# Patient Record
Sex: Female | Born: 1960 | Race: White | Hispanic: No | Marital: Married | Smoking: Never smoker
Health system: Southern US, Community
[De-identification: ages and names within clinical notes are randomized; demographics above are authoritative.]

## PROBLEM LIST (undated history)

## (undated) DIAGNOSIS — Z8632 Personal history of gestational diabetes: Secondary | ICD-10-CM

## (undated) HISTORY — PX: UMBILICAL HERNIA REPAIR: SHX196

## (undated) HISTORY — DX: Personal history of gestational diabetes: Z86.32

## (undated) HISTORY — PX: LAPAROSCOPY: SHX197

---

## 1997-10-13 ENCOUNTER — Inpatient Hospital Stay (HOSPITAL_COMMUNITY): Admission: AD | Admit: 1997-10-13 | Discharge: 1997-10-13 | Payer: Self-pay | Admitting: Gynecology

## 1997-10-27 ENCOUNTER — Other Ambulatory Visit: Admission: RE | Admit: 1997-10-27 | Discharge: 1997-10-27 | Payer: Self-pay | Admitting: Gynecology

## 1998-01-01 ENCOUNTER — Inpatient Hospital Stay (HOSPITAL_COMMUNITY): Admission: AD | Admit: 1998-01-01 | Discharge: 1998-01-04 | Payer: Self-pay | Admitting: Gynecology

## 1998-08-26 ENCOUNTER — Other Ambulatory Visit: Admission: RE | Admit: 1998-08-26 | Discharge: 1998-08-26 | Payer: Self-pay | Admitting: Gynecology

## 1999-10-17 ENCOUNTER — Other Ambulatory Visit: Admission: RE | Admit: 1999-10-17 | Discharge: 1999-10-17 | Payer: Self-pay | Admitting: Gynecology

## 2000-10-17 ENCOUNTER — Other Ambulatory Visit: Admission: RE | Admit: 2000-10-17 | Discharge: 2000-10-17 | Payer: Self-pay | Admitting: Gynecology

## 2001-10-29 ENCOUNTER — Other Ambulatory Visit: Admission: RE | Admit: 2001-10-29 | Discharge: 2001-10-29 | Payer: Self-pay | Admitting: Gynecology

## 2003-04-06 ENCOUNTER — Other Ambulatory Visit: Admission: RE | Admit: 2003-04-06 | Discharge: 2003-04-06 | Payer: Self-pay | Admitting: Gynecology

## 2004-04-24 HISTORY — PX: BACK SURGERY: SHX140

## 2005-02-07 ENCOUNTER — Other Ambulatory Visit: Admission: RE | Admit: 2005-02-07 | Discharge: 2005-02-07 | Payer: Self-pay | Admitting: Gynecology

## 2005-02-17 ENCOUNTER — Emergency Department (HOSPITAL_COMMUNITY): Admission: EM | Admit: 2005-02-17 | Discharge: 2005-02-17 | Payer: Self-pay | Admitting: Family Medicine

## 2005-02-28 ENCOUNTER — Ambulatory Visit (HOSPITAL_COMMUNITY): Admission: RE | Admit: 2005-02-28 | Discharge: 2005-02-28 | Payer: Self-pay | Admitting: Chiropractic Medicine

## 2005-03-25 ENCOUNTER — Ambulatory Visit (HOSPITAL_COMMUNITY): Admission: RE | Admit: 2005-03-25 | Discharge: 2005-03-25 | Payer: Self-pay | Admitting: Neurosurgery

## 2006-03-13 ENCOUNTER — Other Ambulatory Visit: Admission: RE | Admit: 2006-03-13 | Discharge: 2006-03-13 | Payer: Self-pay | Admitting: Gynecology

## 2007-06-18 ENCOUNTER — Other Ambulatory Visit: Admission: RE | Admit: 2007-06-18 | Discharge: 2007-06-18 | Payer: Self-pay | Admitting: Gynecology

## 2009-01-05 ENCOUNTER — Other Ambulatory Visit: Admission: RE | Admit: 2009-01-05 | Discharge: 2009-01-05 | Payer: Self-pay | Admitting: Gynecology

## 2009-01-05 ENCOUNTER — Ambulatory Visit: Payer: Self-pay | Admitting: Gynecology

## 2009-01-05 ENCOUNTER — Encounter: Payer: Self-pay | Admitting: Gynecology

## 2010-03-11 ENCOUNTER — Other Ambulatory Visit: Admission: RE | Admit: 2010-03-11 | Discharge: 2010-03-11 | Payer: Self-pay | Admitting: Gynecology

## 2010-03-11 ENCOUNTER — Ambulatory Visit: Payer: Self-pay | Admitting: Gynecology

## 2010-04-19 ENCOUNTER — Ambulatory Visit: Payer: Self-pay | Admitting: Gynecology

## 2010-09-09 NOTE — Op Note (Signed)
NAMEAPURVA, Janice Sandoval            ACCOUNT NO.:  [id number]   MEDICAL RECORD NO.:  [id number]          PATIENT TYPE:  OIB   LOCATION:  3036                         FACILITY:  [facility]   PHYSICIAN:  Kathaleen Maser. Pool, M.D.    DATE OF BIRTH:  11-Dec-1960   DATE OF PROCEDURE:  03/25/2005  DATE OF DISCHARGE:  03/25/2005                                 OPERATIVE REPORT   ATTENDING PHYSICIAN:  Sherilyn Cooter A. Pool, M.D.   SERVICE:  Neurosurgery.   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   POSTOPERATIVE DIAGNOSIS:  Left L3-4 extrapyramidal herniated nucleus  pulposus with radiculopathy.   PROCEDURE NAME:  Left L3-4 extraforaminal microdiskectomy.   SURGEON:  Kathaleen Maser. Pool, M.D.   ASSISTANT:  Reinaldo Meeker, M.D.   ANESTHESIA:  General oral endotracheal.   INDICATIONS:  Ms. Teschner is a 50 year old female with a history of severe  left lower extremity pain consistent with a left-sided L3 radiculopathy  which has failed conservative management.  Workup demonstrates evidence of a  very large left sided L3-4 extraforaminal disk herniation with marked  compression of the exiting L3 nerve root.  Patient was counseled as to her  options.  She decided to proceed with a left sided L3-4 extraforaminal  microdiskectomy.   OPERATIVE NOTE:  Patient taken to the operating room, placed on the table in  a supine position.  After adequate level of anesthesia achieved, patient  positioned prone onto a Wilson frame and appropriately padded.  The  patient's lumbar region is prepped and draped sterilely.  A 10 blade  speculum linear skin incision overlying the L3-4 interspace.  This was  carried down sharply in the midline.  A subperiosteal dissection was then  performed exposing the lamina facet joints of L3 and L4 as well as the  transverse process of L3.  Deep self-retaining retractor was placed,  intraoperative x-rays taken, level was confirmed.  An  extraforaminal  approach was then performed using the high speed drill to remove a small  aspect of the lateral part of the superior facet of L4.  The intertransverse  ligament was then elevated and resected in a piecemeal fashion using  Kerrison rongeurs.  Microscope was then brought in to the field and used for  microdissection of the left sided L3 nerve root and underlying disk  herniation.  The L3 nerve root was identified.  The L3 nerve root was then  gently mobilized cephalad and the underlying disk herniation was apparent.  The thin membrane overlying the disk herniation was dissected free.  A very  large fragment of disk was then encountered and removed in three large  pieces.  This completely decompressed the muscle at L3 nerve root.  The  extraforaminal space was further explored and no further areas of disk  herniation or nerve root compression was found.  There is no injury to the  thecal sac or nerve roots.  The wound was then irrigated with antibiotic  solution.  Depo-Medrol was then placed over the L3-4 nerve root and then  Gelfoam was placed over the extraforaminal  space.  Retractor system was removed.  Hemostasis of the muscle was achieved with  electrocautery.  Wound was then closed in layers with Vicryl sutures.  Steri-  Strips and a sterile dressing was applied.  There were no apparent  complications.  Patient tolerated the procedure well and she returns to the  recovery room postop.           ______________________________  Kathaleen Maser. Pool, M.D.     HAP/MEDQ  D:  03/25/2005  T:  03/26/2005  Job:  [id number]

## 2011-03-14 ENCOUNTER — Encounter: Payer: Self-pay | Admitting: *Deleted

## 2011-03-14 DIAGNOSIS — Z8632 Personal history of gestational diabetes: Secondary | ICD-10-CM | POA: Insufficient documentation

## 2011-03-14 DIAGNOSIS — N809 Endometriosis, unspecified: Secondary | ICD-10-CM | POA: Insufficient documentation

## 2011-03-23 ENCOUNTER — Encounter: Payer: Self-pay | Admitting: Gynecology

## 2011-04-04 ENCOUNTER — Encounter: Payer: Self-pay | Admitting: Gynecology

## 2011-04-07 ENCOUNTER — Encounter: Payer: Self-pay | Admitting: Gynecology

## 2011-04-07 ENCOUNTER — Ambulatory Visit (INDEPENDENT_AMBULATORY_CARE_PROVIDER_SITE_OTHER): Payer: Managed Care, Other (non HMO) | Admitting: Gynecology

## 2011-04-07 VITALS — BP 114/70 | Ht 66.75 in | Wt 143.0 lb

## 2011-04-07 DIAGNOSIS — Z01419 Encounter for gynecological examination (general) (routine) without abnormal findings: Secondary | ICD-10-CM

## 2011-04-07 DIAGNOSIS — Z131 Encounter for screening for diabetes mellitus: Secondary | ICD-10-CM

## 2011-04-07 DIAGNOSIS — Z13220 Encounter for screening for lipoid disorders: Secondary | ICD-10-CM

## 2011-04-07 LAB — GLUCOSE, RANDOM: Glucose, Bld: 90 mg/dL (ref 70–99)

## 2011-04-07 NOTE — Progress Notes (Signed)
Janice Sandoval 04-28-1960 [id number]        50 y.o.  for annual exam.  Doing well no complaints.  Past medical history,surgical history, medications, allergies, family history and social history were all reviewed and documented in the EPIC chart. ROS:  Was performed and pertinent positives and negatives are included in the history.  Exam: chaperone present Filed Vitals:   04/07/11 1124  BP: 114/70   General appearance  Normal Skin grossly normal Head/Neck normal with no cervical or supraclavicular adenopathy thyroid normal Lungs  clear Cardiac RR, without RMG Abdominal  soft, nontender, without masses, organomegaly or hernia Breasts  examined lying and sitting without masses, retractions, discharge or axillary adenopathy. Pelvic  Ext/BUS/vagina  normal   Cervix  normal    Uterus  introverted, normal size, shape and contour, midline and mobile nontender   Adnexa  Without masses or tenderness    Anus and perineum  normal   Rectovaginal  normal sphincter tone without palpated masses or tenderness.    Assessment/Plan:  50 y.o. female for annual exam.    1. Postmenopausal. Patient is doing well without symptoms. I reviewed the issues of HRT and the potential for prophylactic protection as far as bone and cardiovascular standpoint particular since she underwent apposite early. The WHI study and risks of HRT have been discussed with her. She is not interested in HRT is comfortable with expectant management. 2. Pap smear. Patient has no history of abnormal Pap smears in the past. She has received regular Pap smears with last Pap smear 02/2010. Current screening guidelines with less frequent intervals reviewed and she is comfortable with this, no Pap smear was done today and we'll plan on an every 3 year Pap smear. 3. Breast health. SBE monthly reviewed. Patient had her mammogram last week and will continue with annual mammography. 4. Bone health. Patient had DEXA December 2011 which was  normal. We'll plan repeat in 5 year interval. Increase calcium vitamin D reviewed. 5. Health maintenance. Will check baseline CBC, urinalysis, lipid profile, glucose. Assuming she continues well from a gynecologic standpoint she will see me in a year sooner as needed.    Dara Lords MD, 11:45 AM 04/07/2011

## 2011-04-07 NOTE — Patient Instructions (Signed)
Follow up in one year for annual GYN checkup

## 2011-04-07 NOTE — Progress Notes (Signed)
Addended by: Landis Martins R on: 04/07/2011 12:17 PM   Modules accepted: Orders

## 2011-04-08 LAB — VITAMIN D 25 HYDROXY (VIT D DEFICIENCY, FRACTURES): Vit D, 25-Hydroxy: 23 ng/mL — ABNORMAL LOW (ref 30–89)

## 2011-04-21 ENCOUNTER — Other Ambulatory Visit: Payer: Self-pay | Admitting: *Deleted

## 2012-07-15 ENCOUNTER — Encounter: Payer: Self-pay | Admitting: Gynecology

## 2012-09-10 ENCOUNTER — Other Ambulatory Visit: Payer: Self-pay | Admitting: Dermatology

## 2013-04-04 ENCOUNTER — Other Ambulatory Visit: Payer: Self-pay | Admitting: Dermatology

## 2013-05-01 ENCOUNTER — Other Ambulatory Visit: Payer: Self-pay | Admitting: Dermatology

## 2013-08-19 ENCOUNTER — Other Ambulatory Visit: Payer: Self-pay | Admitting: Dermatology

## 2013-08-29 ENCOUNTER — Other Ambulatory Visit: Payer: Self-pay | Admitting: Dermatology

## 2013-12-24 ENCOUNTER — Encounter: Payer: Self-pay | Admitting: Sports Medicine

## 2013-12-24 ENCOUNTER — Ambulatory Visit (INDEPENDENT_AMBULATORY_CARE_PROVIDER_SITE_OTHER): Payer: Managed Care, Other (non HMO) | Admitting: Sports Medicine

## 2013-12-24 VITALS — BP 125/78 | HR 93 | Ht 67.0 in | Wt 150.0 lb

## 2013-12-24 DIAGNOSIS — M722 Plantar fascial fibromatosis: Secondary | ICD-10-CM

## 2013-12-25 NOTE — Progress Notes (Signed)
   Subjective:    Patient ID: Janice Sandoval, female    DOB: 08-08-1960, 53 y.o.   MRN: [id number]  HPI chief complaint: Left foot pain  Very pleasant 53 year old female comes in today complaining of 1 year of left foot pain. No injury that she can recall but gradual onset of pain that is primarily along the arch of her foot. She does have some mild heel pain as well as. She's not noticed any swelling. Her pain is worse with first step as well as with prolonged walking. In fact, she has had tried giving up and recreational walking and running to see if this would improve her pain but it has not. She denies any pain in her ankle. She has tried some icing with a frozen water bottle but has not had any other treatment. Some pain at rest as well. No numbness or tingling.  Past medical history reviewed Surgical history reviewed. Significant for a microdiscectomy done by Dr. Dutch Quint 8 years ago. She has done well postoperatively. Medications reviewed Allergies reviewed    Review of Systems    as above Objective:   Physical Exam Well-developed, well-nourished. No acute distress. Awake alert and oriented x3. Vital signs reviewed  Left foot: There is tenderness to palpation at the calcaneal insertion of the plantar fascia. Negative calcaneal squeeze. No soft tissue swelling. Slight tenderness to palpation along the proximal and midportion of the plantar fascia as well. Negative Tinel's at the tarsal tunnel. Slightly cavus foot with standing. Neurovascularly intact distally.  MSK ultrasound of the left heel was performed. Long and short images of the plantar fascia were obtained. Plantar fascia is slightly thickened at 0.53 cm and there is a hypoechoic area at the plantar most aspect of the plantar fascia at the insertion onto the calcaneus. There is also an accompanying spur in this area. Findings are consistent with plantar fasciitis and a small partial plantar fascial tear.       Assessment &  Plan:  Left foot pain secondary to plantar fasciitis  Green sports insoles for cushioning. Arch strap. Daily icing. Daily plantar fascial stretching. She can continue with activity as tolerated including running using pain as her guide. Followup in 4 weeks for reevaluation. If symptoms persist or worsen I would consider further diagnostic imaging.

## 2014-02-02 ENCOUNTER — Ambulatory Visit (INDEPENDENT_AMBULATORY_CARE_PROVIDER_SITE_OTHER): Payer: Managed Care, Other (non HMO) | Admitting: Sports Medicine

## 2014-02-02 VITALS — BP 127/91

## 2014-02-02 DIAGNOSIS — M722 Plantar fascial fibromatosis: Secondary | ICD-10-CM

## 2014-02-02 NOTE — Progress Notes (Signed)
   Subjective:    Patient ID: Janice Sandoval, female    DOB: December 07, 1960, 53 y.o.   MRN: [id number]  HPI Patient comes in today for followup on left foot plantar fasciitis. She feels like her pain is slightly improved. She is able to do a little more walking. She has been diligent about icing it admits that she has been doing this a little less over the past week or so. She is compliant with her plantar fascial stretches. She has found the arch strap to be very useful. Her symptoms have been present now for a little over a year. She has green sports insoles which she finds comfortable.    Review of Systems     Objective:   Physical Exam Well-developed, well-nourished. No acute distress. Awake alert and oriented x3. Vital signs reviewed  There is still tenderness to palpation at the left calcaneus at the plantar fascial insertion. No pain with calcaneal squeeze. No soft tissue swelling. She has a flexible pes cavus foot and supinates slightly with walking. Walking without a limp.       Assessment & Plan:  Left foot plantar fasciitis  Patient's symptoms are slowly improving. I am going to add a lateral heel wedge to her green sports insole. She found this comfortable prior to leaving the office. I gave her a new arch strap as well. She will followup in 3 weeks at which point we will plan on making her some custom orthotics. The plan is to add lateral heel wedges to those orthotics if she finds them to be comfortable in the interim.

## 2014-02-23 ENCOUNTER — Encounter: Payer: Self-pay | Admitting: Sports Medicine

## 2014-02-23 ENCOUNTER — Ambulatory Visit: Payer: Managed Care, Other (non HMO) | Admitting: Sports Medicine

## 2014-12-30 ENCOUNTER — Encounter (HOSPITAL_COMMUNITY): Payer: Self-pay | Admitting: Emergency Medicine

## 2014-12-30 ENCOUNTER — Emergency Department (INDEPENDENT_AMBULATORY_CARE_PROVIDER_SITE_OTHER)
Admission: EM | Admit: 2014-12-30 | Discharge: 2014-12-30 | Disposition: A | Discharge status: Home / Self Care | Payer: 59 | Source: Home / Self Care | Attending: Emergency Medicine | Admitting: Emergency Medicine

## 2014-12-30 DIAGNOSIS — N390 Urinary tract infection, site not specified: Secondary | ICD-10-CM

## 2014-12-30 LAB — POCT URINALYSIS DIP (DEVICE)
Bilirubin Urine: NEGATIVE
Glucose, UA: 250 mg/dL — AB
Ketones, ur: NEGATIVE mg/dL
Nitrite: POSITIVE — AB
Protein, ur: 100 mg/dL — AB
Specific Gravity, Urine: 1.015 (ref 1.005–1.030)
UROBILINOGEN UA: 1 mg/dL (ref 0.0–1.0)
pH: 6.5 (ref 5.0–8.0)

## 2014-12-30 MED ORDER — CEPHALEXIN 500 MG PO CAPS: 500.0000 mg | ORAL_CAPSULE | Freq: Four times a day (QID) | ORAL | Status: DC

## 2014-12-30 MED ORDER — PHENAZOPYRIDINE HCL 200 MG PO TABS: 200.0000 mg | ORAL_TABLET | Freq: Three times a day (TID) | ORAL | Status: DC

## 2014-12-30 NOTE — Discharge Instructions (Signed)
You have a urinary tract infection. Take Keflex 4 times a day for 3 days. Use Pyridium 3 times a day for the next 2 days. This will make your urine a funny color. Things should improve over the next 24-48 hours. Follow-up as needed.

## 2014-12-30 NOTE — ED Notes (Signed)
Pt has had urinary frequency, blood in her urine and burning with urination since last night.  She took Azo last night which relieved the symptoms until this morning.

## 2014-12-30 NOTE — ED Provider Notes (Signed)
CSN: [id number]     Arrival date & time 12/30/14  1642 History   First MD Initiated Contact with Patient 12/30/14 1732     Chief Complaint  Patient presents with  . Urinary Tract Infection   (Consider location/radiation/quality/duration/timing/severity/associated sxs/prior Treatment) HPI  She is a 54 year old woman here for evaluation of dysuria. Symptoms started last night with dysuria, hematuria, and urinary frequency. She denies any abdominal pain, flank pain, fevers. She does report a little bit of nausea today. She has been taking over-the-counter AZO with temporary improvement. She states she has had 2 or 3 urinary tract infections in the past.  Past Medical History  Diagnosis Date  . Endometriosis   . Hx gestational diabetes    Past Surgical History  Procedure Laterality Date  . Umbilical hernia repair    . Cesarean section w/btl    . Laparoscopy      x3  . Back surgery  2006   Family History  Problem Relation Age of Onset  . Hypertension Father   . Breast cancer Maternal Grandmother 68   Social History  Substance Use Topics  . Smoking status: Never Smoker   . Smokeless tobacco: Never Used  . Alcohol Use: Yes   OB History    Gravida Para Term Preterm AB TAB SAB Ectopic Multiple Living   Review of Systems As in history of present illness Allergies  Review of patient's allergies indicates no known allergies.  Home Medications   Prior to Admission medications   Medication Sig Start Date End Date Taking? Authorizing Provider  Ibuprofen (ADVIL PO) Take by mouth.     Yes Historical Provider, MD  cephALEXin (KEFLEX) 500 MG capsule Take 1 capsule (500 mg total) by mouth 4 (four) times daily. 12/30/14   Charm Rings, MD  naproxen sodium (ANAPROX) 220 MG tablet Take 220 mg by mouth 2 (two) times daily with a meal.      Historical Provider, MD  phenazopyridine (PYRIDIUM) 200 MG tablet Take 1 tablet (200 mg total) by mouth 3 (three) times daily. 12/30/14    Charm Rings, MD   Meds Ordered and Administered this Visit  Medications - No data to display  BP 137/98 mmHg  Pulse 82  Temp(Src) 98 F (36.7 C) (Oral)  Resp 18  SpO2 99%  LMP 04/24/2008 No data found.   Physical Exam  Constitutional: She appears well-developed and well-nourished. No distress.  Cardiovascular: Normal rate.   Pulmonary/Chest: Effort normal.  Abdominal: Soft. There is no tenderness.  No CVA tenderness.    ED Course  Procedures (including critical care time)  Labs Review Labs Reviewed  POCT URINALYSIS DIP (DEVICE) - Abnormal; Notable for the following:    Glucose, UA 250 (*)    Hgb urine dipstick MODERATE (*)    Protein, ur 100 (*)    Nitrite POSITIVE (*)    Leukocytes, UA LARGE (*)    All other components within normal limits  URINE CULTURE    Imaging Review No results found.    MDM   1. UTI (lower urinary tract infection)    UA consistent with infection. Urine sent for culture. Treat with Keflex and Pyridium. Return precautions reviewed.    Charm Rings, MD 12/30/14 [phone]

## 2014-12-31 ENCOUNTER — Ambulatory Visit: Payer: Self-pay | Admitting: Gynecology

## 2015-01-01 LAB — URINE CULTURE: Culture: >=100000

## 2015-01-05 NOTE — ED Notes (Signed)
Final report of UA culture positive for E-Coli, sensitive to Rx provided day of visit

## 2015-02-11 ENCOUNTER — Encounter: Payer: Self-pay | Admitting: Gynecology

## 2015-02-11 ENCOUNTER — Ambulatory Visit (INDEPENDENT_AMBULATORY_CARE_PROVIDER_SITE_OTHER): Payer: 59 | Admitting: Gynecology

## 2015-02-11 ENCOUNTER — Other Ambulatory Visit (HOSPITAL_COMMUNITY)
Admission: RE | Admit: 2015-02-11 | Discharge: 2015-02-11 | Disposition: A | Discharge status: Home / Self Care | Payer: 59 | Source: Ambulatory Visit | Attending: Gynecology | Admitting: Gynecology

## 2015-02-11 VITALS — BP 118/74 | Ht 68.0 in | Wt 154.0 lb

## 2015-02-11 DIAGNOSIS — Z1151 Encounter for screening for human papillomavirus (HPV): Secondary | ICD-10-CM | POA: Insufficient documentation

## 2015-02-11 DIAGNOSIS — Z01419 Encounter for gynecological examination (general) (routine) without abnormal findings: Secondary | ICD-10-CM | POA: Insufficient documentation

## 2015-02-11 NOTE — Patient Instructions (Signed)
Follow up for your blood work.  You may obtain a copy of any labs that were done today by logging onto MyChart as outlined in the instructions provided with your AVS (after visit summary). The office will not call with normal lab results but certainly if there are any significant abnormalities then we will contact you.   Health Maintenance Adopting a healthy lifestyle and getting preventive care can go a long way to promote health and wellness. Talk with your health care provider about what schedule of regular examinations is right for you. This is a good chance for you to check in with your provider about disease prevention and staying healthy. In between checkups, there are plenty of things you can do on your own. Experts have done a lot of research about which lifestyle changes and preventive measures are most likely to keep you healthy. Ask your health care provider for more information. WEIGHT AND DIET  Eat a healthy diet  Be sure to include plenty of vegetables, fruits, low-fat dairy products, and lean protein.  Do not eat a lot of foods high in solid fats, added sugars, or salt.  Get regular exercise. This is one of the most important things you can do for your health.  Most adults should exercise for at least 150 minutes each week. The exercise should increase your heart rate and make you sweat (moderate-intensity exercise).  Most adults should also do strengthening exercises at least twice a week. This is in addition to the moderate-intensity exercise.  Maintain a healthy weight  Body mass index (BMI) is a measurement that can be used to identify possible weight problems. It estimates body fat based on height and weight. Your health care provider can help determine your BMI and help you achieve or maintain a healthy weight.  For females 31 years of age and older:   A BMI below 18.5 is considered underweight.  A BMI of 18.5 to 24.9 is normal.  A BMI of 25 to 29.9 is considered  overweight.  A BMI of 30 and above is considered obese.  Watch levels of cholesterol and blood lipids  You should start having your blood tested for lipids and cholesterol at 52 years of age, then have this test every 5 years.  You may need to have your cholesterol levels checked more often if:  Your lipid or cholesterol levels are high.  You are older than 52 years of age.  You are at high risk for heart disease.  CANCER SCREENING   Lung Cancer  Lung cancer screening is recommended for adults 26-61 years old who are at high risk for lung cancer because of a history of smoking.  A yearly low-dose CT scan of the lungs is recommended for people who:  Currently smoke.  Have quit within the past 15 years.  Have at least a 30-pack-year history of smoking. A pack year is smoking an average of one pack of cigarettes a day for 1 year.  Yearly screening should continue until it has been 15 years since you quit.  Yearly screening should stop if you develop a health problem that would prevent you from having lung cancer treatment.  Breast Cancer  Practice breast self-awareness. This means understanding how your breasts normally appear and feel.  It also means doing regular breast self-exams. Let your health care provider know about any changes, no matter how small.  If you are in your 20s or 30s, you should have a clinical breast exam (CBE)  by a health care provider every 1-3 years as part of a regular health exam.  If you are 32 or older, have a CBE every year. Also consider having a breast X-ray (mammogram) every year.  If you have a family history of breast cancer, talk to your health care provider about genetic screening.  If you are at high risk for breast cancer, talk to your health care provider about having an MRI and a mammogram every year.  Breast cancer gene (BRCA) assessment is recommended for women who have family members with BRCA-related cancers. BRCA-related  cancers include:  Breast.  Ovarian.  Tubal.  Peritoneal cancers.  Results of the assessment will determine the need for genetic counseling and BRCA1 and BRCA2 testing. Cervical Cancer Routine pelvic examinations to screen for cervical cancer are no longer recommended for nonpregnant women who are considered low risk for cancer of the pelvic organs (ovaries, uterus, and vagina) and who do not have symptoms. A pelvic examination may be necessary if you have symptoms including those associated with pelvic infections. Ask your health care provider if a screening pelvic exam is right for you.   The Pap test is the screening test for cervical cancer for women who are considered at risk.  If you had a hysterectomy for a problem that was not cancer or a condition that could lead to cancer, then you no longer need Pap tests.  If you are older than 65 years, and you have had normal Pap tests for the past 10 years, you no longer need to have Pap tests.  If you have had past treatment for cervical cancer or a condition that could lead to cancer, you need Pap tests and screening for cancer for at least 20 years after your treatment.  If you no longer get a Pap test, assess your risk factors if they change (such as having a new sexual partner). This can affect whether you should start being screened again.  Some women have medical problems that increase their chance of getting cervical cancer. If this is the case for you, your health care provider may recommend more frequent screening and Pap tests.  The human papillomavirus (HPV) test is another test that may be used for cervical cancer screening. The HPV test looks for the virus that can cause cell changes in the cervix. The cells collected during the Pap test can be tested for HPV.  The HPV test can be used to screen women 56 years of age and older. Getting tested for HPV can extend the interval between normal Pap tests from three to five  years.  An HPV test also should be used to screen women of any age who have unclear Pap test results.  After 52 years of age, women should have HPV testing as often as Pap tests.  Colorectal Cancer  This type of cancer can be detected and often prevented.  Routine colorectal cancer screening usually begins at 52 years of age and continues through 52 years of age.  Your health care provider may recommend screening at an earlier age if you have risk factors for colon cancer.  Your health care provider may also recommend using home test kits to check for hidden blood in the stool.  A small camera at the end of a tube can be used to examine your colon directly (sigmoidoscopy or colonoscopy). This is done to check for the earliest forms of colorectal cancer.  Routine screening usually begins at age 64.  Direct  examination of the colon should be repeated every 5-10 years through 52 years of age. However, you may need to be screened more often if early forms of precancerous polyps or small growths are found. Skin Cancer  Check your skin from head to toe regularly.  Tell your health care provider about any new moles or changes in moles, especially if there is a change in a mole's shape or color.  Also tell your health care provider if you have a mole that is larger than the size of a pencil eraser.  Always use sunscreen. Apply sunscreen liberally and repeatedly throughout the day.  Protect yourself by wearing long sleeves, pants, a wide-brimmed hat, and sunglasses whenever you are outside. HEART DISEASE, DIABETES, AND HIGH BLOOD PRESSURE   Have your blood pressure checked at least every 1-2 years. High blood pressure causes heart disease and increases the risk of stroke.  If you are between 49 years and 3 years old, ask your health care provider if you should take aspirin to prevent strokes.  Have regular diabetes screenings. This involves taking a blood sample to check your fasting  blood sugar level.  If you are at a normal weight and have a low risk for diabetes, have this test once every three years after 52 years of age.  If you are overweight and have a high risk for diabetes, consider being tested at a younger age or more often. PREVENTING INFECTION  Hepatitis B  If you have a higher risk for hepatitis B, you should be screened for this virus. You are considered at high risk for hepatitis B if:  You were born in a country where hepatitis B is common. Ask your health care provider which countries are considered high risk.  Your parents were born in a high-risk country, and you have not been immunized against hepatitis B (hepatitis B vaccine).  You have HIV or AIDS.  You use needles to inject street drugs.  You live with someone who has hepatitis B.  You have had sex with someone who has hepatitis B.  You get hemodialysis treatment.  You take certain medicines for conditions, including cancer, organ transplantation, and autoimmune conditions. Hepatitis C  Blood testing is recommended for:  Everyone born from [birth year] through 1963.  Anyone with known risk factors for hepatitis C. Sexually transmitted infections (STIs)  You should be screened for sexually transmitted infections (STIs) including gonorrhea and chlamydia if:  You are sexually active and are younger than 52 years of age.  You are older than 52 years of age and your health care provider tells you that you are at risk for this type of infection.  Your sexual activity has changed since you were last screened and you are at an increased risk for chlamydia or gonorrhea. Ask your health care provider if you are at risk.  If you do not have HIV, but are at risk, it may be recommended that you take a prescription medicine daily to prevent HIV infection. This is called pre-exposure prophylaxis (PrEP). You are considered at risk if:  You are sexually active and do not regularly use condoms or know  the HIV status of your partner(s).  You take drugs by injection.  You are sexually active with a partner who has HIV. Talk with your health care provider about whether you are at high risk of being infected with HIV. If you choose to begin PrEP, you should first be tested for HIV. You should then be tested  every 3 months for as long as you are taking PrEP.  PREGNANCY   If you are premenopausal and you may become pregnant, ask your health care provider about preconception counseling.  If you may become pregnant, take 400 to 800 micrograms (mcg) of folic acid every day.  If you want to prevent pregnancy, talk to your health care provider about birth control (contraception). OSTEOPOROSIS AND MENOPAUSE   Osteoporosis is a disease in which the bones lose minerals and strength with aging. This can result in serious bone fractures. Your risk for osteoporosis can be identified using a bone density scan.  If you are 12 years of age or older, or if you are at risk for osteoporosis and fractures, ask your health care provider if you should be screened.  Ask your health care provider whether you should take a calcium or vitamin D supplement to lower your risk for osteoporosis.  Menopause may have certain physical symptoms and risks.  Hormone replacement therapy may reduce some of these symptoms and risks. Talk to your health care provider about whether hormone replacement therapy is right for you.  HOME CARE INSTRUCTIONS   Schedule regular health, dental, and eye exams.  Stay current with your immunizations.   Do not use any tobacco products including cigarettes, chewing tobacco, or electronic cigarettes.  If you are pregnant, do not drink alcohol.  If you are breastfeeding, limit how much and how often you drink alcohol.  Limit alcohol intake to no more than 1 drink per day for nonpregnant women. One drink equals 12 ounces of beer, 5 ounces of wine, or 1 ounces of hard liquor.  Do not  use street drugs.  Do not share needles.  Ask your health care provider for help if you need support or information about quitting drugs.  Tell your health care provider if you often feel depressed.  Tell your health care provider if you have ever been abused or do not feel safe at home. Document Released: 10/24/2010 Document Revised: 08/25/2013 Document Reviewed: 03/12/2013 [facility] Patient Information 2015 [place], [place]. This information is not intended to replace advice given to you by your health care provider. Make sure you discuss any questions you have with your health care provider.

## 2015-02-11 NOTE — Addendum Note (Signed)
Addended by: Dayna BarkerGARDNER, KIMBERLY K on: 02/11/2015 11:30 AM   Modules accepted: Orders

## 2015-02-11 NOTE — Progress Notes (Signed)
Janice EvangelistMelanie R Sandoval November 08, 1960 [id number]        54 y.o.  [id number] for annual exam.  Has not been in the offices 2011. Overall doing well without complaints. Underwent premature menopause and remains without menses or any bleeding.  Past medical history,surgical history, problem list, medications, allergies, family history and social history were all reviewed and documented as reviewed in the EPIC chart.  ROS:  Performed with pertinent positives and negatives included in the history, assessment and plan.   Additional significant findings :  none   Exam: [name] Filed Vitals:   02/11/15 1031  BP: 118/74  Height: 5\' 8"  (1.727 m)  Weight: 154 lb (69.854 kg)   General appearance:  Normal affect, orientation and appearance. Skin: Grossly normal HEENT: Without gross lesions.  No cervical or supraclavicular adenopathy. Thyroid normal.  Lungs:  Clear without wheezing, rales or rhonchi Cardiac: RR, without RMG Abdominal:  Soft, nontender, without masses, guarding, rebound, organomegaly or hernia Breasts:  Examined lying and sitting without masses, retractions, discharge or axillary adenopathy. Pelvic:  Ext/BUS/vagina normal with mild atrophic changes  Cervix normal. Pap/HPV  Uterus anteverted, normal size, shape and contour, midline and mobile nontender   Adnexa  Without masses or tenderness    Anus and perineum  Normal   Rectovaginal  Normal sphincter tone without palpated masses or tenderness.    Assessment/Plan:  54 y.o. [id number] female for annual exam.   1. Postmenopausal. Patient doing well without significant hot flushes, night sweats, vaginal dryness or any vaginal bleeding. With previously  Discussed the issues of HRT and she is not interested. Call if any vaginal bleeding. 2. Mammography 2014. Patient knows she is overdue and agrees to call and schedule. 3. Pap smear 2011. HPV today. No history of abnormal Pap smears previously. 4. Colonoscopy 2016. Repeat at their  recommended interval. 5. DEXA 2011 negative. Plan repeat at age 54. Increased calcium vitamin D reviewed. 6. Health maintenance. Patient requests future orders to return for fasting blood work.  CBC, comprehensive metabolic panel, lipid profile, TSH, vitamin D and hemoglobin A1c ordered. She does report having a transient elevated glucose in the past that her primary physicians. Check urinalysis today. Follow up 1 year, sooner as needed.   [name],Janice Sandoval P MD, 10:59 AM 02/11/2015

## 2015-02-12 LAB — URINALYSIS W MICROSCOPIC + REFLEX CULTURE
BILIRUBIN URINE: NEGATIVE
Bacteria, UA: NONE SEEN [HPF]
Casts: NONE SEEN [LPF]
Crystals: NONE SEEN [HPF]
Glucose, UA: NEGATIVE
Hgb urine dipstick: NEGATIVE
Ketones, ur: NEGATIVE
Leukocytes, UA: NEGATIVE
Nitrite: NEGATIVE
Protein, ur: NEGATIVE
RBC / HPF: NONE SEEN RBC/HPF (ref <=2)
Specific Gravity, Urine: 1.015 (ref 1.001–1.035)
Squamous Epithelial / LPF: NONE SEEN [HPF] (ref <=5)
WBC UA: NONE SEEN WBC/HPF (ref <=5)
YEAST: NONE SEEN [HPF]
pH: 6 (ref 5.0–8.0)

## 2015-02-12 LAB — CYTOLOGY - PAP

## 2015-02-17 ENCOUNTER — Other Ambulatory Visit: Payer: 59

## 2015-04-20 ENCOUNTER — Telehealth: Payer: Self-pay | Admitting: *Deleted

## 2015-04-20 NOTE — Telephone Encounter (Signed)
Left message for pt to call.

## 2015-04-20 NOTE — Telephone Encounter (Signed)
There is a viral URI going around which probably accounts for his symptoms. Prophylactic use of Tamiflu is recommended in household contacts of people with known influenza. Her husband really should be seen and evaluated and if indeed he would come up positive then household contacts should be treated.

## 2015-04-20 NOTE — Telephone Encounter (Signed)
Pt called requesting Rx for Tamiflu, no symptoms, pt said her husband has flu like symptoms and fever x 4 days. I asked pt to call PCP, and pt said she did and they declined to send Rx. Pt asked me to check with you regarding Rx or if you think Rx is even needed? Please advise

## 2015-04-20 NOTE — Telephone Encounter (Signed)
Patient informed. 

## 2015-05-27 ENCOUNTER — Encounter: Payer: Self-pay | Admitting: Gynecology

## 2015-11-03 ENCOUNTER — Encounter: Payer: Self-pay | Admitting: Sports Medicine

## 2015-11-03 ENCOUNTER — Ambulatory Visit (INDEPENDENT_AMBULATORY_CARE_PROVIDER_SITE_OTHER): Payer: 59 | Admitting: Sports Medicine

## 2015-11-03 ENCOUNTER — Ambulatory Visit
Admission: RE | Admit: 2015-11-03 | Discharge: 2015-11-03 | Disposition: A | Discharge status: Home / Self Care | Payer: 59 | Source: Ambulatory Visit | Attending: Sports Medicine | Admitting: Sports Medicine

## 2015-11-03 VITALS — BP 117/85 | HR 77 | Ht 67.0 in | Wt 143.0 lb

## 2015-11-03 DIAGNOSIS — M2241 Chondromalacia patellae, right knee: Secondary | ICD-10-CM

## 2015-11-03 DIAGNOSIS — M25561 Pain in right knee: Secondary | ICD-10-CM | POA: Diagnosis not present

## 2015-11-03 MED ORDER — MELOXICAM 15 MG PO TABS: ORAL_TABLET | ORAL | Status: DC

## 2015-11-03 NOTE — Progress Notes (Signed)
   Subjective:    Patient ID: Janice EvangelistMelanie R Ratti, female    DOB: 08-27-1960, [age] y.o.   MRN: [id number]  HPI chief complaint: Right knee pain  Patient comes in today complaining of 3 weeks of anterior right knee pain. No trauma that she can recall but a gradual onset of pain that began without any specific inciting event. She is very active and enjoys walking, especially on a treadmill with an incline of about 10. She states that this is not new for her but she may have been doing it a little more frequently recently than in the past. She is complaining of anterior knee pain that is present with activity. It is worse with going up and down stairs but is also present with walking. She denies any similar problems in the past. She has tried a patellar Cho-Pat strap but that made her pain worse. She denies mechanical symptoms. She's not noticed any swelling. No feelings of instability. No problems with this knee in the past. No prior knee surgeries. No associated numbness or tingling. She has been taking 800 mg of Advil as needed but has not noticed any benefit from this.  Interim medical history reviewed Medications reviewed Allergies reviewed    Review of Systems    as above Objective:   Physical Exam  Well-developed, well-nourished. No acute distress. Awake alert and oriented 3. Vital signs reviewed  Right knee: Full range of motion. No effusion. 2+ patellofemoral crepitus. VMO weakness. She is tender to palpation along the lateral patellar facet and has a positive patellar grind. No tenderness along the medial or lateral joint lines. Knee is stable to valgus and varus stressing. Negative anterior drawer, negative posterior drawer. Negative McMurray's. Negative Thessaly's. Neurovascularly intact distally. Walking without a significant limp.  X-rays of the right knee including an AP, lateral, and sunrise view show a small bone spur off of the lateral aspect of the patella on the sunrise view.  She also has some mild degenerative changes in the medial compartment. Nothing acute is seen.      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 VMO exercises to be done daily. She is instructed to avoid squats and lunges but it's okay to continue with other activity using pain as her guide. Follow-up with me in 4 weeks for reevaluation. If symptoms persist, we did discussed the merits of an intra-articular cortisone injection. Call with questions or concerns in the interim.

## 2015-11-24 ENCOUNTER — Ambulatory Visit: Payer: 59 | Admitting: Sports Medicine

## 2015-11-30 ENCOUNTER — Other Ambulatory Visit: Payer: Self-pay | Admitting: *Deleted

## 2015-11-30 DIAGNOSIS — M25561 Pain in right knee: Secondary | ICD-10-CM

## 2015-11-30 MED ORDER — MELOXICAM 15 MG PO TABS: 15.0000 mg | ORAL_TABLET | Freq: Every day | ORAL | 1 refills | Status: DC | PRN

## 2015-12-07 ENCOUNTER — Ambulatory Visit: Payer: 59 | Admitting: Sports Medicine

## 2016-02-14 ENCOUNTER — Encounter: Payer: 59 | Admitting: Gynecology

## 2016-02-14 DIAGNOSIS — Z0289 Encounter for other administrative examinations: Secondary | ICD-10-CM

## 2016-05-26 ENCOUNTER — Encounter: Payer: Self-pay | Admitting: Gynecology

## 2017-04-09 IMAGING — CR DG KNEE AP/LAT W/ SUNRISE*R*
2 series · 2 of 2 positions shown · non-contrast
Comparison: None.

CLINICAL DATA: Right knee pain for several weeks without known
injury.

EXAM:
RIGHT KNEE 3 VIEWS

[w knee ap right]
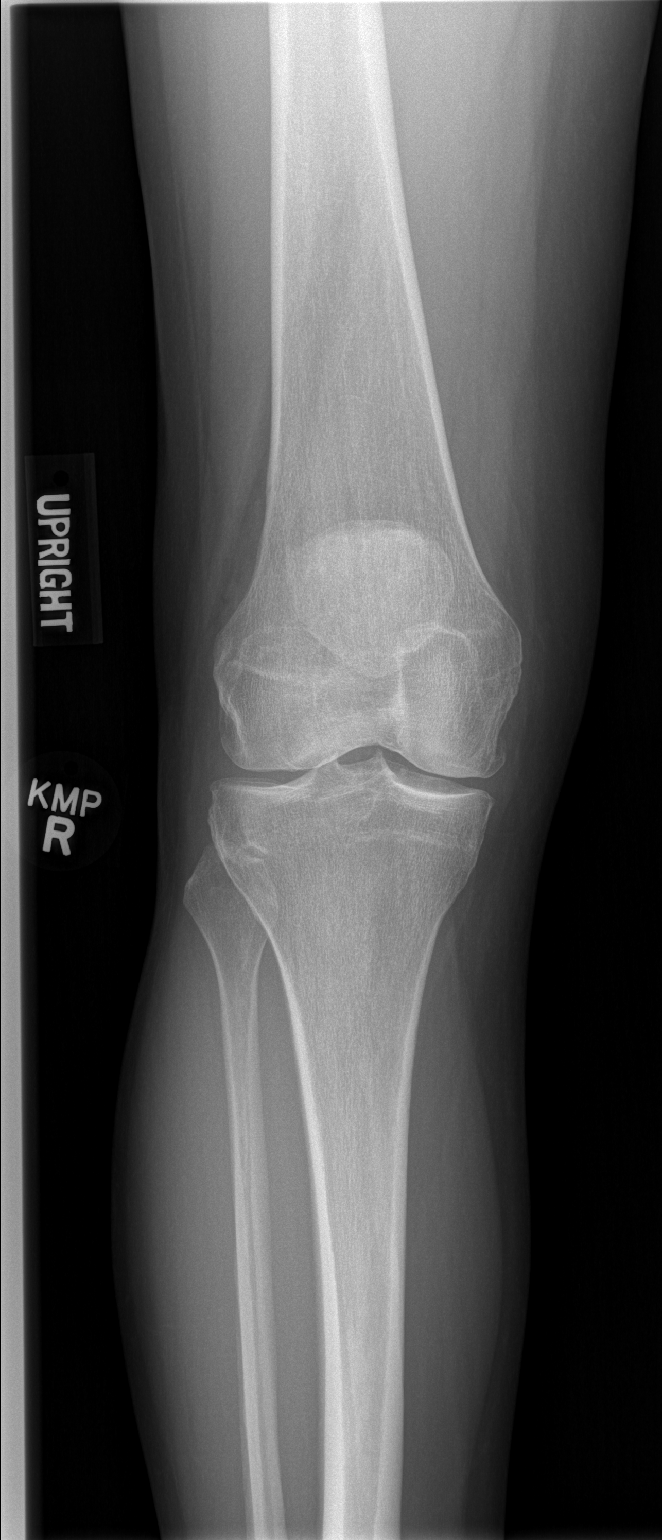

[w knee obl. right]
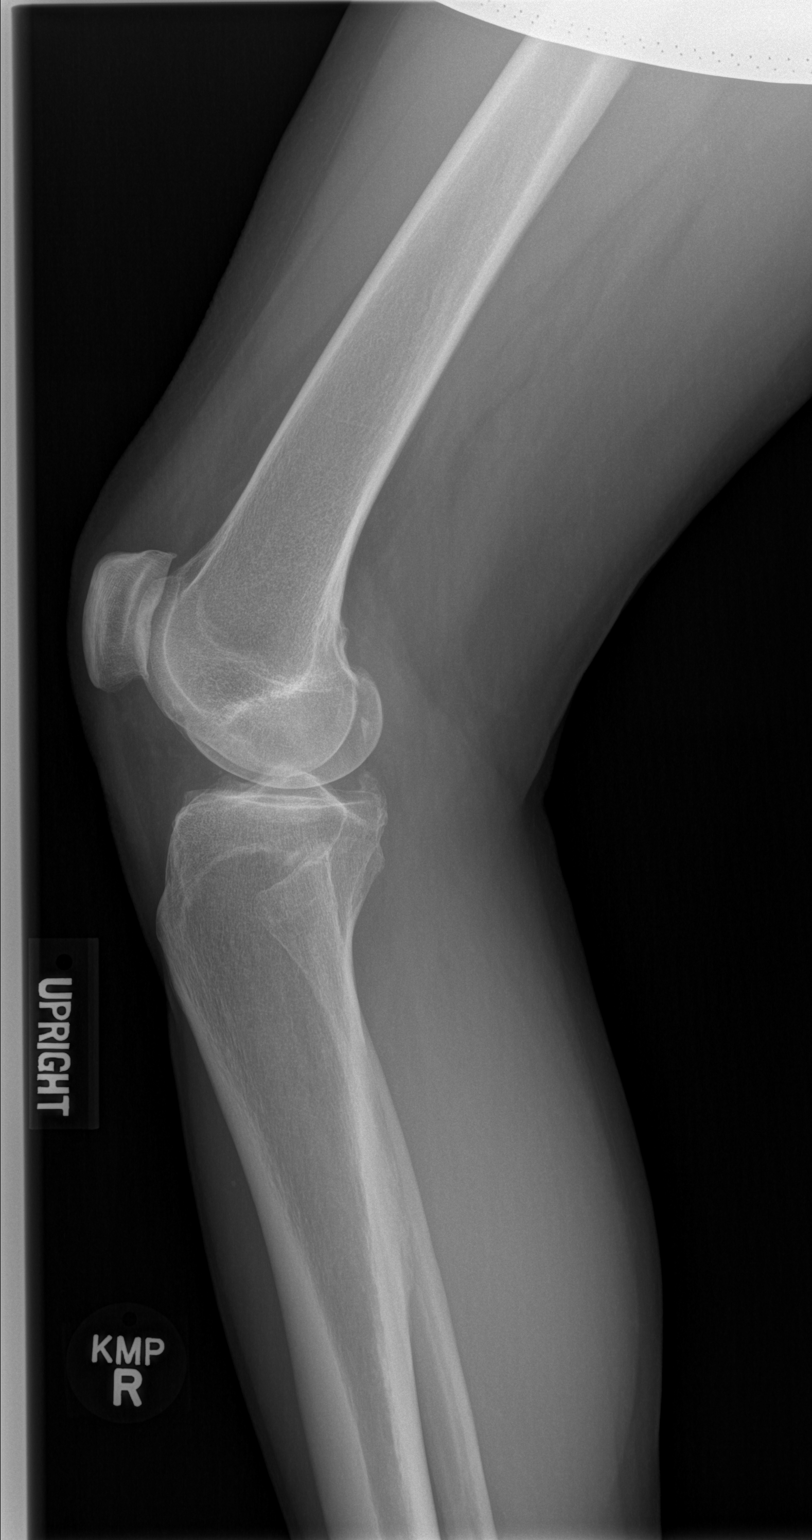

[2 of 2 positions shown; findings below may reference images not displayed]

FINDINGS: No evidence of fracture, dislocation, or joint effusion. Mild
narrowing of medial joint space is noted with osteophyte formation.
Soft tissues are unremarkable.
IMPRESSION: Mild degenerative joint disease is noted medially. No acute
abnormality seen in the right knee.

## 2018-01-28 ENCOUNTER — Ambulatory Visit (INDEPENDENT_AMBULATORY_CARE_PROVIDER_SITE_OTHER): Payer: 59 | Admitting: Sports Medicine

## 2018-01-28 VITALS — BP 132/82 | Ht 67.0 in | Wt 145.0 lb

## 2018-01-28 DIAGNOSIS — M25561 Pain in right knee: Secondary | ICD-10-CM | POA: Diagnosis not present

## 2018-01-28 NOTE — Progress Notes (Signed)
     Subjective:  HPI: Janice Sandoval is a 57 y.o. presenting to clinic today to discuss the following:  Left Knee Pain Patient states she was at the beach carrying chairs up a sand dune yesterday and lost her balance and fell. She states she did not hear a "pop" or feel a "pop". She was able to get up unassisted and bear weight. She iced her knee during her return trip home but has some residual soreness in the area above the top of her knee and in the back of her leg. This does not feel like the pain she has in her other knee where she has known right knee patella-femoral DJD.  ROS noted in HPI.   Past Medical, Surgical, Social, and Family History Reviewed & Updated per EMR.   Pertinent Historical Findings include:   Social History   Tobacco Use  Smoking Status Never Smoker  Smokeless Tobacco Never Used   Objective: BP 132/82   Ht 5\' 7"  (1.702 m)   Wt 145 lb (65.8 kg)   LMP 04/24/2008   BMI 22.71 kg/m  Vitals and nursing notes reviewed  Physical Exam Gen: Alert and Oriented x 3, NAD HEENT: Normocephalic, atraumatic Left Knee: TTP at the quad tendon insertion on the superior patella, medial joint line, and the medial aspect of the head of the gastrocnemius muscle. Right knee has full extension, flexion only to 100 degrees. Anterior and posterior drawer test negative, varus and valgus testing negative on the right, Thessaly test negative. Ext: no clubbing, cyanosis, or edema Neuro: No gross deficits, gross sensation intact, 5/5 strength LE bilateral Skin: warm, dry, intact, no rashes  Imaging Left Knee Ultrasound: Findings consistent with trace knee effusion. Visualized portions of the lateral meniscus and medial meniscus intact with no evidence of tear. Medial right knee bone spur on the tibia visualized.  Scan of the proximal medial head of the gastroc does show some diffuse hypoechoic changes consistent with strain but I do not see a specific tear.  No results found  for this or any previous visit (from the past 72 hour(s)).  Assessment/Plan:  Acute pain of right knee Patient presents with what is most likely a mild strain of both the quad tendon and the medial head of the gastrocnemius muscle. - Cont ice, Naproxen OTC as needed, and OTC muscle creams - Exercise as pain allows but avoid squats and lunges - Body Helix for one week - Follow up as needed; return if it worsens or does not improve in one week   PATIENT EDUCATION PROVIDED: See AVS    Diagnosis and plan along with any newly prescribed medication(s) were discussed in detail with this patient today. The patient verbalized understanding and agreed with the plan. Patient advised if symptoms worsen return to clinic or ER.    Jules Schick, DO 01/28/2018, 2:31 PM PGY-2 [facility]  Patient seen and evaluated with the resident.  I agree with the above plan of care.  Patient's knee exam is very reassuring.  Point of maximum tenderness is the proximal medial head of the gastrocnemius.  Ultrasound does show some hypoechoic changes in this area.  She also has a trace knee effusion.  Proceed with treatment as above.  Symptoms should improve over the next 2 to 3 weeks.  If symptoms persist or patient notices swelling in the knee then she will return to the office for reevaluation and further work-up.  Otherwise, follow-up as needed.

## 2018-01-28 NOTE — Assessment & Plan Note (Signed)
Patient presents with what is most likely a mild strain of both the quad tendon and the medial head of the gastrocnemius muscle. - Cont ice, Naproxen OTC as needed, and OTC muscle creams - Exercise as pain allows but avoid squats and lunges - Body Helix for one week - Follow up as needed; return if it worsens or does not improve in one week

## 2018-01-28 NOTE — Patient Instructions (Signed)
It was great to meet you today! Thank you for letting me participate in your care!  Today, we discussed your left knee and calf pain. Using ultrasound we did find a small amount of fluid on your knee and it does appear you have a mild calf strain. You may work out on an elliptical if you can tolerate the discomfort. This should improve within a week using rest, ice, OTC NSAIDS such as Naproxen, and a Body Helix sleeve for you knee. If it does not improve please return to the clinic for further evaluation.  Be well, Jules Schick, DO PGY-2, [facility]

## 2018-01-29 ENCOUNTER — Encounter: Payer: Self-pay | Admitting: Sports Medicine

## 2019-01-21 ENCOUNTER — Encounter: Payer: Self-pay | Admitting: Gynecology

## 2021-02-07 ENCOUNTER — Ambulatory Visit (INDEPENDENT_AMBULATORY_CARE_PROVIDER_SITE_OTHER): Payer: 59 | Admitting: Sports Medicine

## 2021-02-07 VITALS — BP 120/70 | Ht 67.0 in | Wt 143.0 lb

## 2021-02-07 DIAGNOSIS — M25561 Pain in right knee: Secondary | ICD-10-CM

## 2021-02-07 DIAGNOSIS — G8929 Other chronic pain: Secondary | ICD-10-CM | POA: Diagnosis not present

## 2021-02-07 MED ORDER — METHYLPREDNISOLONE ACETATE 40 MG/ML IJ SUSP
40.0000 mg | Freq: Once | INTRAMUSCULAR | Status: AC
  Administered 2021-02-07: 40 mg via INTRA_ARTICULAR

## 2021-02-07 NOTE — Progress Notes (Signed)
   Subjective:    Patient ID: Janice Sandoval, female    DOB: [birth year], 60 y.o.   MRN: [id number]  HPI chief complaint: Right knee pain  Pasty presents today complaining of chronic anterior right knee pain.  She was last seen in the office with a similar complaint in 2017.  X-rays at that time showed some spurring of the patellofemoral joint as well as some degenerative changes in the medial compartment.  She was placed on meloxicam and instructed to avoid squats and lunges.  She has had rather persistent pain since then.  It is an achy pain that can get as high as 5 or 6 out of 10 on the pain scale.  It is most noticeable with activity such as going up and down stairs or with getting up on her treadmill.  She does admit that she continues to do lunges and squats as part of her workouts.  No recent trauma.  She has noticed some mild swelling intermittently.  No stiffness.    Review of Systems As above    Objective:   Physical Exam  Developed, well nourished.  No acute distress  Right knee: Patient has about a 2 to 3 degree extension lag with full flexion.  2+ patellofemoral crepitus.  No tenderness to palpation along medial or lateral joint lines.  Knee is stable to valgus and varus stressing.  Negative anterior drawer, negative posterior drawer.  Negative Thessaly's.  Neurovascularly intact distally.      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  This was accomplished atraumatically under sterile technique after risks and benefits were explained.  An anterior medial approach was utilized.  She tolerated this without difficulty.  I recommended that she try compression sleeve when exercising.  I also reiterated the importance of avoiding exercises that place her knee into a deep bend such as squats and lunges.  If she continues to have pain despite today's injection then she will  notify me and I will start with getting updated x-rays of the right knee.  We may ultimately need to consider MRI as well.  Follow-up for ongoing or recalcitrant issues.  Consent obtained and verified. Time-out conducted. Noted no overlying erythema, induration, or other signs of local infection. Skin prepped in a sterile fashion. Topical analgesic spray: Ethyl chloride. Joint: Right knee Needle: 25-gauge 1.5 inch Completed without difficulty. Meds: 3 cc 1% Xylocaine, 1 cc (40 mg) Depo-Medrol  Advised to call if fevers/chills, erythema, induration, drainage, or persistent bleeding.   This note was dictated using Dragon naturally speaking software and may contain errors in syntax, spelling, or content which have not been identified prior to signing this note.

## 2022-04-05 ENCOUNTER — Ambulatory Visit
Admission: RE | Admit: 2022-04-05 | Discharge: 2022-04-05 | Disposition: A | Discharge status: Home / Self Care | Payer: 59 | Source: Ambulatory Visit | Attending: Internal Medicine | Admitting: Internal Medicine

## 2022-04-05 ENCOUNTER — Other Ambulatory Visit: Payer: Self-pay | Admitting: Internal Medicine

## 2022-04-05 DIAGNOSIS — R310 Gross hematuria: Secondary | ICD-10-CM

## 2023-08-10 ENCOUNTER — Encounter (HOSPITAL_COMMUNITY): Payer: Self-pay | Admitting: Emergency Medicine

## 2023-08-10 ENCOUNTER — Ambulatory Visit (HOSPITAL_COMMUNITY)
Admission: EM | Admit: 2023-08-10 | Discharge: 2023-08-10 | Disposition: A | Discharge status: Home / Self Care | Attending: Nurse Practitioner | Admitting: Nurse Practitioner

## 2023-08-10 DIAGNOSIS — N3001 Acute cystitis with hematuria: Secondary | ICD-10-CM | POA: Diagnosis present

## 2023-08-10 LAB — POCT URINALYSIS DIP (MANUAL ENTRY)
Bilirubin, UA: NEGATIVE
Glucose, UA: NEGATIVE mg/dL
Ketones, POC UA: NEGATIVE mg/dL
Nitrite, UA: NEGATIVE
Spec Grav, UA: 1.015 (ref 1.010–1.025)
Urobilinogen, UA: 0.2 U/dL
pH, UA: 7 (ref 5.0–8.0)

## 2023-08-10 MED ORDER — NITROFURANTOIN MONOHYD MACRO 100 MG PO CAPS: 100.0000 mg | ORAL_CAPSULE | Freq: Two times a day (BID) | ORAL | 0 refills | Status: AC

## 2023-08-10 NOTE — ED Triage Notes (Signed)
 Pt reports this morning had blood in urine, cloudy. Reports pain started yesterday. Tried drinking a lot of water.

## 2023-08-10 NOTE — ED Provider Notes (Signed)
 [facility]    CSN: [id number] Arrival date & time: 08/10/23  1153      History   Chief Complaint Chief Complaint  Patient presents with   Hematuria    HPI Janice Sandoval is a 63 y.o. female.   HPI  She is in today for evaluation of hematuria.  She endorses that she noticed blood in her urine and she has increased her water intake without clearing.  She denies history of recurrent UTI's.  She feels like she may have been treated a year or more ago.  She reports that she has had symptoms and when her urine was collected it was would be negative.  She endorses that her mother does suffer from recurrent urinary tract infections.  She denies any fever, chills, nausea, vomiting.  She is having pressure with voiding.  She endorses that she is had an ultrasound in the past and indicated that she had a small renal stone.  She denies any flank pain. She is concerned today about her blood pressure being elevated.  She denies a history of hypertension.  She endorses that she was recently started on a low-dose cholesterol medication.  She reports that her good cholesterol is high however her LDL was continuing to rise.  She reports that she was to have a that was calcification in her arteries.  She endorses that she has a headache.  She denies dizziness, nosebleeds, chest pains or shortness of breath.  She denies swelling in legs feet or ankles.  She does have right knee pain for which she uses Advil twice daily.  She endorses that her job is somewhat stressful. Past Medical History:  Diagnosis Date   Endometriosis    Hx gestational diabetes     Patient Active Problem List   Diagnosis Date Noted   Acute pain of right knee 01/28/2018   Endometriosis    History of gestational diabetes     Past Surgical History:  Procedure Laterality Date   BACK SURGERY  2006   CESAREAN SECTION W/BTL     LAPAROSCOPY     x3   UMBILICAL HERNIA REPAIR      OB History     Gravida  5    Para  3   Term      Preterm      AB  2   Living  3      SAB      IAB      Ectopic      Multiple      Live Births               Home Medications    Prior to Admission medications   Medication Sig Start Date End Date Taking? Authorizing Provider  nitrofurantoin , macrocrystal-monohydrate, (MACROBID ) 100 MG capsule Take 1 capsule (100 mg total) by mouth 2 (two) times daily. 08/10/23  Yes Gregoria Leas, NP  ALPRAZolam (XANAX) 0.5 MG tablet Take 0.5 mg by mouth at bedtime as needed for anxiety.    [provider]  amphetamine-dextroamphetamine (ADDERALL) 20 MG tablet Take 20 mg by mouth 3 (three) times a week.  08/18/15   [provider]  fluticasone (FLONASE) 50 MCG/ACT nasal spray INT 1 SPRAY INTO EACH NOSTRIL ONCE D 08/07/15   [provider]  Ibuprofen (ADVIL PO) Take by mouth.      [provider]    Family History Family History  Problem Relation Age of Onset   Hypertension Father  Cancer Father        Stomach   Breast cancer Maternal Grandmother 5    Social History Social History   Tobacco Use   Smoking status: Never   Smokeless tobacco: Never  Substance Use Topics   Alcohol use: Yes    Alcohol/week: 0.0 standard drinks of alcohol    Comment: Social   Drug use: No     Allergies   Patient has no known allergies.   Review of Systems Review of Systems   Physical Exam Triage Vital Signs ED Triage Vitals  Encounter Vitals Group     BP 08/10/23 1237 (!) 147/104     Systolic BP Percentile --      Diastolic BP Percentile --      Pulse Rate 08/10/23 1237 69     Resp 08/10/23 1237 17     Temp --      Temp src --      SpO2 08/10/23 1237 (!) 64 %     Weight --      Height --      Head Circumference --      Peak Flow --      Pain Score 08/10/23 1236 4     Pain Loc --      Pain Education --      Exclude from Growth Chart --    No data found.  Updated Vital Signs BP (!) 147/104 (BP Location: Left  Arm)   Pulse 69   Resp 17   LMP 04/24/2008   SpO2 (!) 64%   Visual Acuity Right Eye Distance:   Left Eye Distance:   Bilateral Distance:    Right Eye Near:   Left Eye Near:    Bilateral Near:     Physical Exam Constitutional:      General: She is not in acute distress.    Appearance: She is normal weight.  HENT:     Head: Normocephalic.  Cardiovascular:     Rate and Rhythm: Normal rate.  Pulmonary:     Effort: Pulmonary effort is normal.  Musculoskeletal:        General: Normal range of motion.  Skin:    General: Skin is warm and dry.     Capillary Refill: Capillary refill takes less than 2 seconds.  Neurological:     General: No focal deficit present.     Mental Status: She is alert and oriented to person, place, and time.  Psychiatric:        Mood and Affect: Mood normal.        Behavior: Behavior normal.      UC Treatments / Results  Labs (all labs ordered are listed, but only abnormal results are displayed) Labs Reviewed  POCT URINALYSIS DIP (MANUAL ENTRY) - Abnormal; Notable for the following components:      Result Value   Clarity, UA cloudy (*)    Blood, UA large (*)    Protein Ur, POC trace (*)    Leukocytes, UA Small (1+) (*)    All other components within normal limits  URINE CULTURE    EKG   Radiology No results found.  Procedures Procedures (including critical care time)  Medications Ordered in UC Medications - No data to display  Initial Impression / Assessment and Plan / UC Course  I have reviewed the triage vital signs and the nursing notes.  Pertinent labs & imaging results that were available during my care of the patient were reviewed by me  and considered in my medical decision making (see chart for details).     Blood in urine Final Clinical Impressions(s) / UC Diagnoses   Final diagnoses:  Acute cystitis with hematuria     Discharge Instructions      You have been diagnosed with urinary tract infection.  You are  prescribed Macrobid  100 mg twice daily for 5 days.  Again a culture has been sent to ensure that this is the best treatment option for your urinary tract infection.  If the culture indicates that the antibiotic needs to be changed you will be notified via phone by nurse for additional treatment recommendations.  You are encouraged to continue to hydrate well with water.  You are recommended to have at least 2 L of water per day.  You are also encouraged to drink 100% cranberry juice 6 to 8 ounces daily while experiencing the UTI symptoms.  You are encouraged to follow-up with your primary care provider if the hematuria (blood in urine) continues for further evaluation with additional ultrasound etc. For the elevated blood pressure the recommendation is for you to avoid NSAIDs, Advil, Aleve, ibuprofen.  You are encouraged to take Tylenol for mild aches and pains.  As discussed we encouraged decreasing salt intake along with stress levels.  Daily exercise does help support heart health which does help with blood pressure.  You have been provided information on ways to manage hypertension.  As recommended monitor blood pressure daily or every other day for the next week or more if your blood pressure consistently is greater than 140/90 you are encouraged to set up an appointment with your primary care provider for treatment of hypertension.     ED Prescriptions     Medication Sig Dispense Auth. Provider   nitrofurantoin , macrocrystal-monohydrate, (MACROBID ) 100 MG capsule Take 1 capsule (100 mg total) by mouth 2 (two) times daily. 10 capsule Gregoria Leas, NP      PDMP not reviewed this encounter.   Eleanore Grey J.F. [place], [place] 08/10/23 1328

## 2023-08-10 NOTE — Discharge Instructions (Addendum)
 You have been diagnosed with urinary tract infection.  You are prescribed Macrobid  100 mg twice daily for 5 days.  Again a culture has been sent to ensure that this is the best treatment option for your urinary tract infection.  If the culture indicates that the antibiotic needs to be changed you will be notified via phone by nurse for additional treatment recommendations.  You are encouraged to continue to hydrate well with water.  You are recommended to have at least 2 L of water per day.  You are also encouraged to drink 100% cranberry juice 6 to 8 ounces daily while experiencing the UTI symptoms.  You are encouraged to follow-up with your primary care provider if the hematuria (blood in urine) continues for further evaluation with additional ultrasound etc. For the elevated blood pressure the recommendation is for you to avoid NSAIDs, Advil, Aleve, ibuprofen.  You are encouraged to take Tylenol for mild aches and pains.  As discussed we encouraged decreasing salt intake along with stress levels.  Daily exercise does help support heart health which does help with blood pressure.  You have been provided information on ways to manage hypertension.  As recommended monitor blood pressure daily or every other day for the next week or more if your blood pressure consistently is greater than 140/90 you are encouraged to set up an appointment with your primary care provider for treatment of hypertension.

## 2023-08-11 LAB — URINE CULTURE: Culture: <10000 — AB
